# Patient Record
Sex: Female | Born: 1985 | Race: White | Hispanic: No | Smoking: Current every day smoker
Health system: Southern US, Community
[De-identification: ages and names within clinical notes are randomized; demographics above are authoritative.]

## PROBLEM LIST (undated history)

## (undated) DIAGNOSIS — I10 Essential (primary) hypertension: Secondary | ICD-10-CM

## (undated) HISTORY — PX: TUBAL LIGATION: SHX77

## (undated) HISTORY — PX: ABDOMINAL HYSTERECTOMY: SHX81

## (undated) HISTORY — PX: TONSILLECTOMY: SUR1361

## (undated) HISTORY — PX: CHOLECYSTECTOMY: SHX55

## (undated) HISTORY — PX: ADENOIDECTOMY: SUR15

---

## 2014-04-19 ENCOUNTER — Emergency Department (HOSPITAL_COMMUNITY): Payer: Medicaid - Out of State

## 2014-04-19 ENCOUNTER — Encounter (HOSPITAL_COMMUNITY): Payer: Self-pay | Admitting: Emergency Medicine

## 2014-04-19 ENCOUNTER — Emergency Department (HOSPITAL_COMMUNITY)
Admission: EM | Admit: 2014-04-19 | Discharge: 2014-04-19 | Disposition: A | Discharge status: Home / Self Care | Payer: Medicaid - Out of State | Attending: Emergency Medicine | Admitting: Emergency Medicine

## 2014-04-19 DIAGNOSIS — S139XXA Sprain of joints and ligaments of unspecified parts of neck, initial encounter: Secondary | ICD-10-CM | POA: Insufficient documentation

## 2014-04-19 DIAGNOSIS — I10 Essential (primary) hypertension: Secondary | ICD-10-CM | POA: Insufficient documentation

## 2014-04-19 DIAGNOSIS — F17200 Nicotine dependence, unspecified, uncomplicated: Secondary | ICD-10-CM | POA: Insufficient documentation

## 2014-04-19 DIAGNOSIS — Y9389 Activity, other specified: Secondary | ICD-10-CM | POA: Insufficient documentation

## 2014-04-19 DIAGNOSIS — Y92410 Unspecified street and highway as the place of occurrence of the external cause: Secondary | ICD-10-CM | POA: Insufficient documentation

## 2014-04-19 DIAGNOSIS — S0990XA Unspecified injury of head, initial encounter: Secondary | ICD-10-CM | POA: Insufficient documentation

## 2014-04-19 DIAGNOSIS — Z79899 Other long term (current) drug therapy: Secondary | ICD-10-CM | POA: Insufficient documentation

## 2014-04-19 DIAGNOSIS — S161XXA Strain of muscle, fascia and tendon at neck level, initial encounter: Secondary | ICD-10-CM

## 2014-04-19 HISTORY — DX: Essential (primary) hypertension: I10

## 2014-04-19 MED ORDER — HYDROMORPHONE HCL PF 1 MG/ML IJ SOLN
1.0000 mg | Freq: Once | INTRAMUSCULAR | Status: AC
  Administered 2014-04-19: 1 mg via INTRAMUSCULAR
  Filled 2014-04-19: qty 1

## 2014-04-19 MED ORDER — IBUPROFEN 800 MG PO TABS: 800.0000 mg | ORAL_TABLET | Freq: Three times a day (TID) | ORAL | Status: AC | PRN

## 2014-04-19 MED ORDER — LORAZEPAM 1 MG PO TABS
1.0000 mg | ORAL_TABLET | Freq: Once | ORAL | Status: AC
  Administered 2014-04-19: 1 mg via ORAL
  Filled 2014-04-19: qty 1

## 2014-04-19 MED ORDER — TRAMADOL HCL 50 MG PO TABS: 50.0000 mg | ORAL_TABLET | Freq: Four times a day (QID) | ORAL | Status: AC | PRN

## 2014-04-19 NOTE — Discharge Instructions (Signed)
Follow up if not improving
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
## 2015-01-20 IMAGING — CT CT HEAD W/O CM
3 of 5 series · 14 of 33 positions shown, 16 images · non-contrast
Comparison: None.

CLINICAL DATA: NECK PAIN

EXAM:
CT HEAD WITHOUT CONTRAST
CT CERVICAL SPINE WITHOUT CONTRAST
TECHNIQUE: Multidetector CT imaging of the head and cervical spine was
performed following the standard protocol without intravenous
contrast. Multiplanar CT image reconstructions of the cervical spine
were also generated.

[Series 6: cervical st 2.0 b31s · axial · 0.33mm/px · z∈[+88,+240]mm · 6 of 98 slices shown, 8 images]
[im 11/98  soft-tissue]
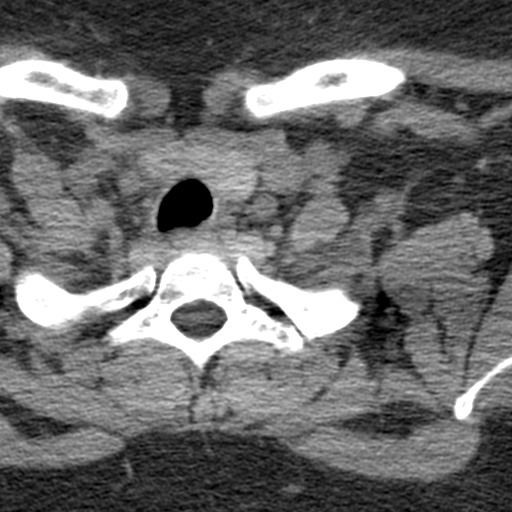
[im 11/98  bone]
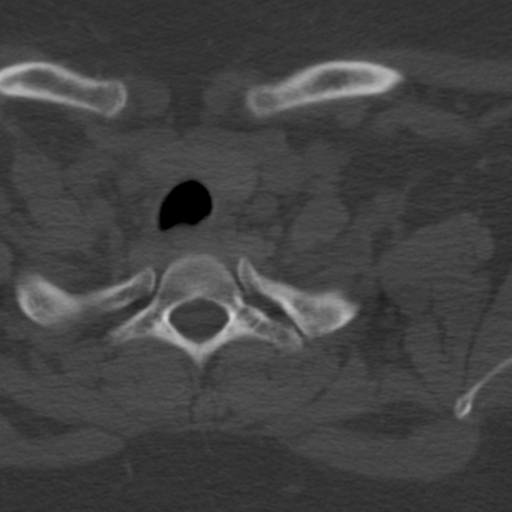
[im 33/98  bone]
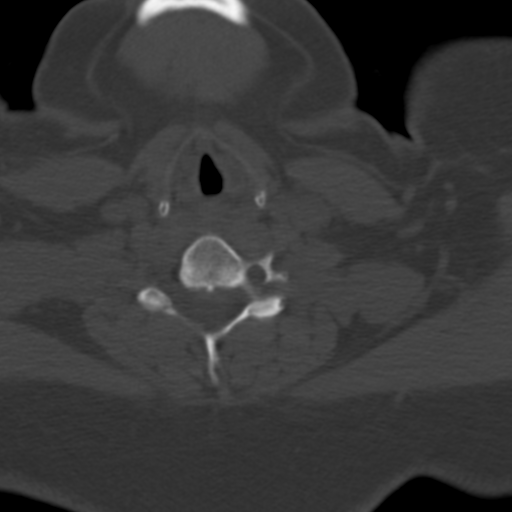
[im 44/98  bone]
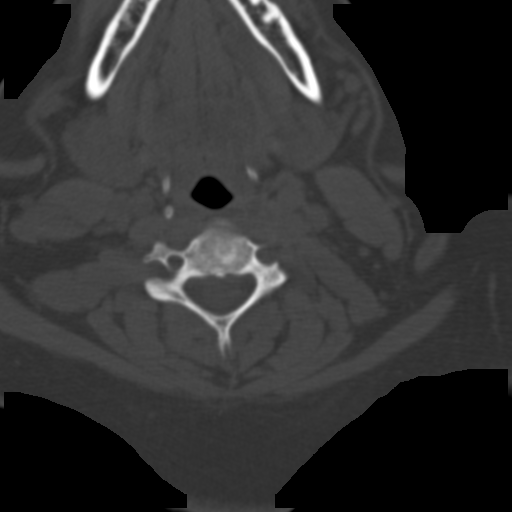
[im 54/98  bone]
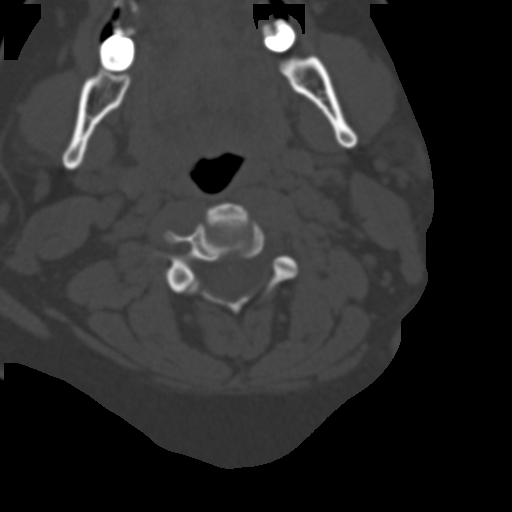
[im 76/98  soft-tissue]
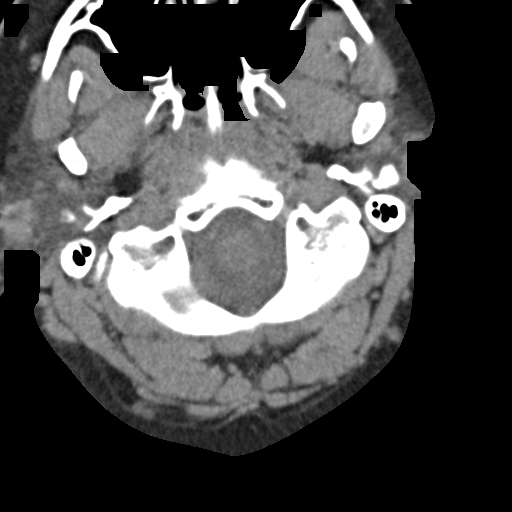
[im 76/98  bone]
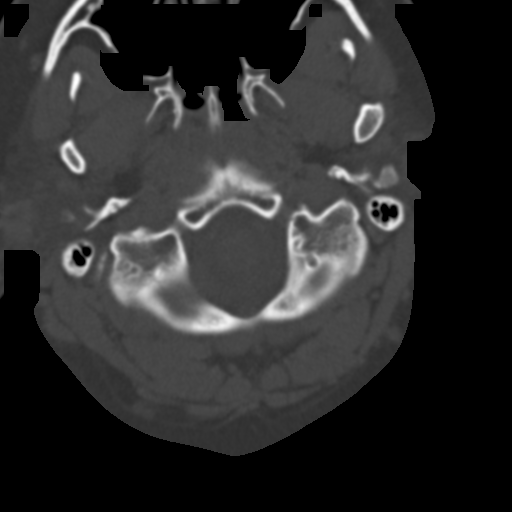
[im 87/98  bone]
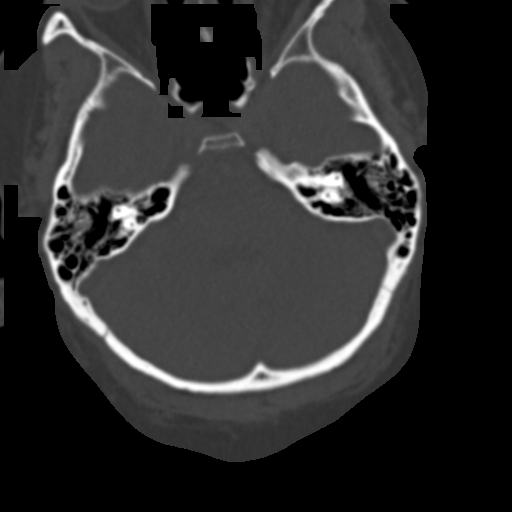

[Series 9: coronal bone 2.0 · coronal · 0.33mm/px · 3 of 88 slices shown]
[im 18/88  bone]
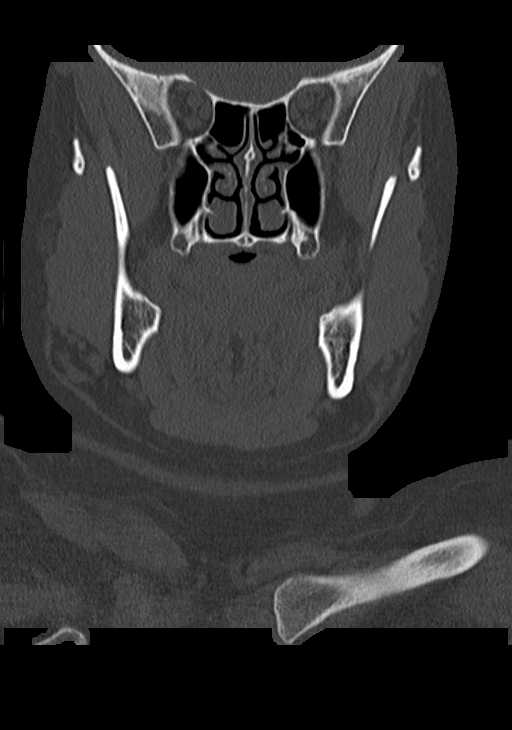
[im 35/88  bone]
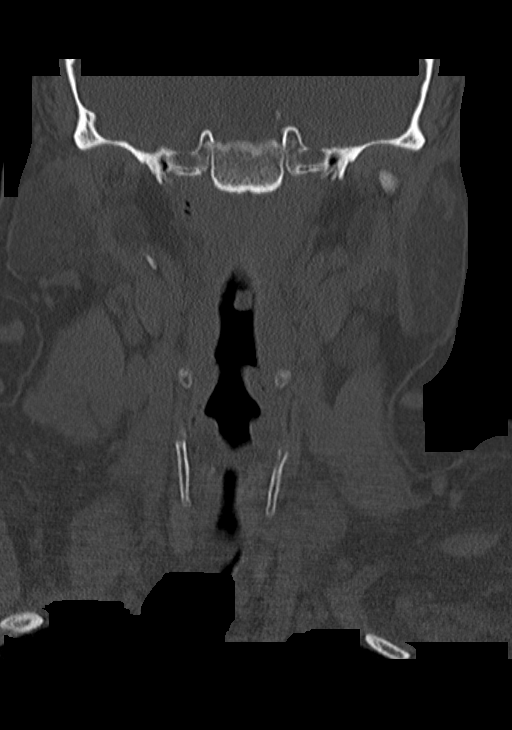
[im 53/88  bone]
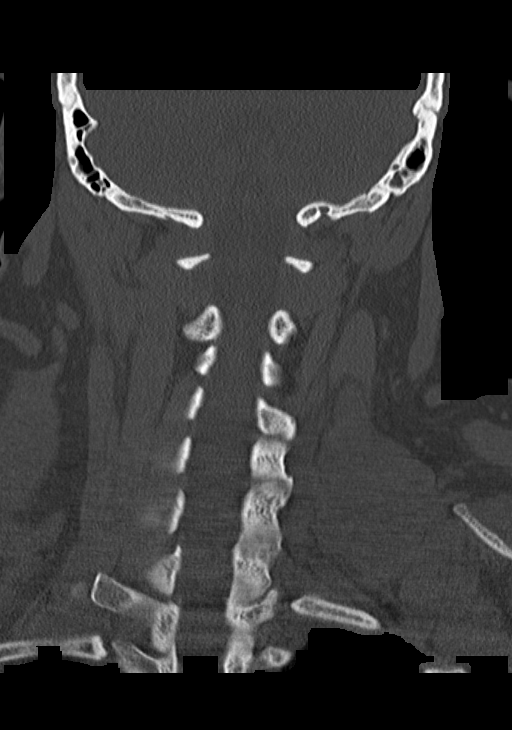

[Series 10: axial bone 2.0 · sagittal · 0.32mm/px · 5 of 94 slices shown]
[im 14/94  bone]
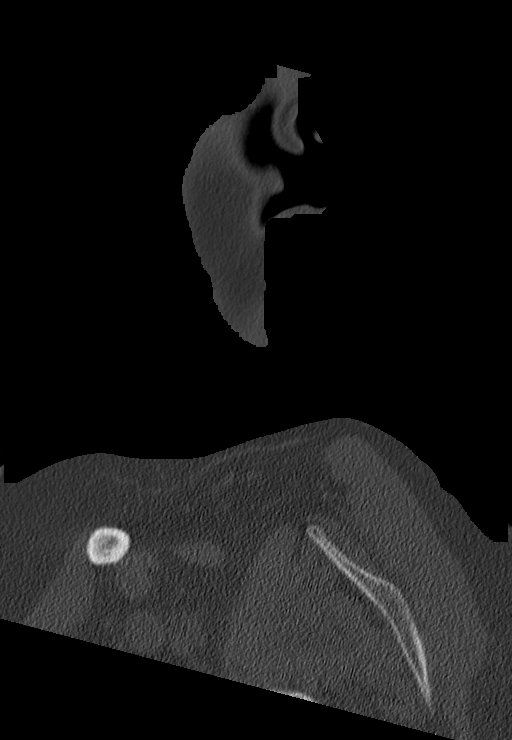
[im 27/94  bone]
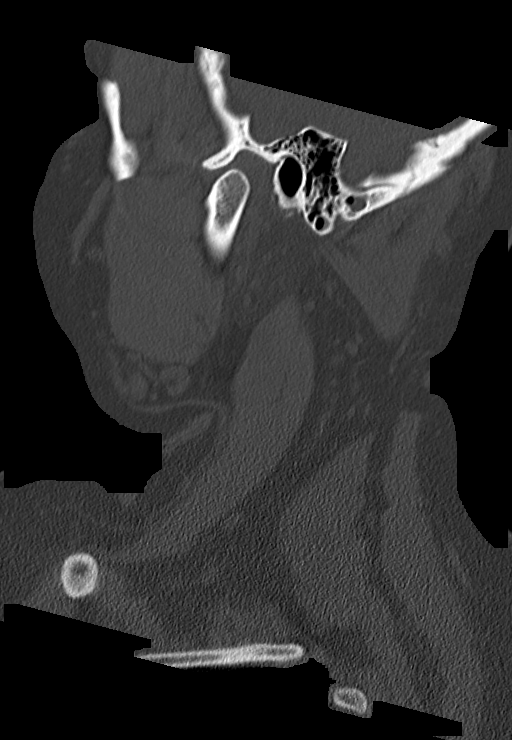
[im 40/94  bone]
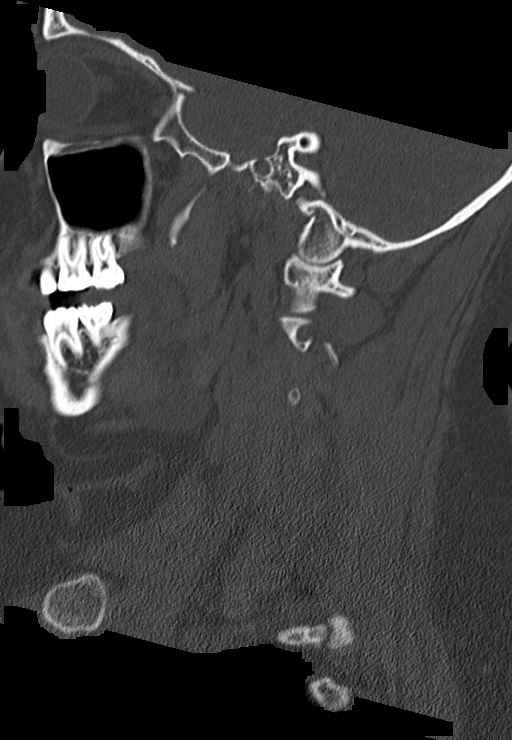
[im 54/94  bone]
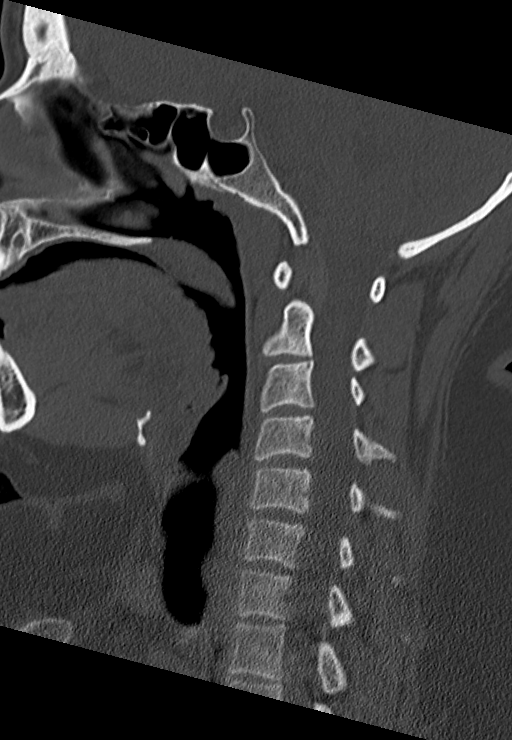
[im 67/94  bone]
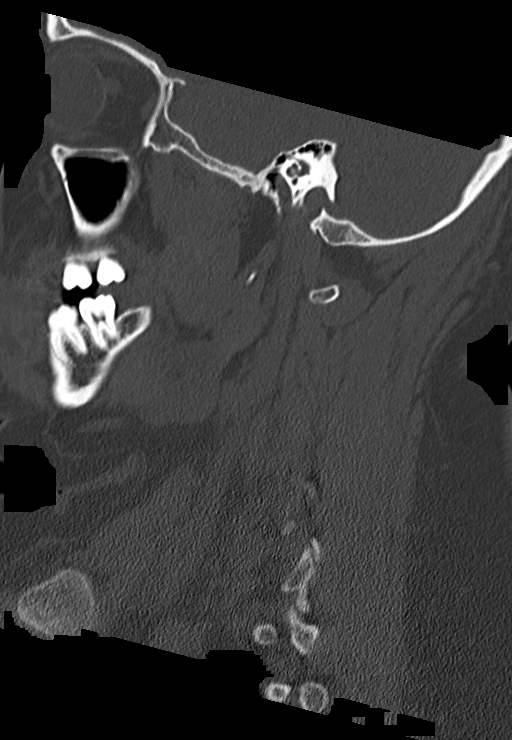

[14 of 33 positions shown; findings below may reference images not displayed]

FINDINGS: CT HEAD FINDINGS

No acute intracranial abnormality. Specifically, no hemorrhage,
hydrocephalus, mass lesion, acute infarction, or significant
intracranial injury. No acute calvarial abnormality. Visualized
paranasal sinuses and mastoid air cells are patent.

CT CERVICAL SPINE FINDINGS

Mild left lateral tilt of the cervical spine which may represent
positioning, color placement, or muscle spasm. Disk spaces are
normal and there is no significant disk degeneration. No spondylosis
is identified and there is no spinal or foraminal stenosis. There is
no prevertebral soft tissue thickening.

No fracture is identified in the cervical spine. No mass lesion is
present.
IMPRESSION: No acute intracranial abnormality.

No focal acute osseous abnormalities within the cervical spine. Mild
left lateral tilt of cervical spine differential considerations are
positioning, muscle spasm, or collar placement.
